# Patient Record
Sex: Female | Born: 1940 | Race: White | Hispanic: No | Marital: Married | Smoking: Never smoker
Health system: Southern US, Community
[De-identification: ages and names within clinical notes are randomized; demographics above are authoritative.]

## PROBLEM LIST (undated history)

## (undated) DIAGNOSIS — I10 Essential (primary) hypertension: Secondary | ICD-10-CM

## (undated) HISTORY — PX: TOTAL KNEE ARTHROPLASTY: SHX125

## (undated) HISTORY — PX: BACK SURGERY: SHX140

## (undated) HISTORY — PX: CHOLECYSTECTOMY: SHX55

## (undated) HISTORY — PX: OTHER SURGICAL HISTORY: SHX169

---

## 2015-03-03 ENCOUNTER — Encounter: Payer: Self-pay | Admitting: Emergency Medicine

## 2015-03-03 ENCOUNTER — Observation Stay
Admission: EM | Admit: 2015-03-03 | Discharge: 2015-03-04 | Disposition: A | Discharge status: Home / Self Care | Payer: Medicare Other | Attending: Internal Medicine | Admitting: Internal Medicine

## 2015-03-03 ENCOUNTER — Emergency Department: Payer: Medicare Other

## 2015-03-03 ENCOUNTER — Observation Stay: Payer: Medicare Other

## 2015-03-03 DIAGNOSIS — Z6837 Body mass index (BMI) 37.0-37.9, adult: Secondary | ICD-10-CM | POA: Diagnosis not present

## 2015-03-03 DIAGNOSIS — Z880 Allergy status to penicillin: Secondary | ICD-10-CM | POA: Insufficient documentation

## 2015-03-03 DIAGNOSIS — I440 Atrioventricular block, first degree: Secondary | ICD-10-CM | POA: Insufficient documentation

## 2015-03-03 DIAGNOSIS — Z79899 Other long term (current) drug therapy: Secondary | ICD-10-CM | POA: Insufficient documentation

## 2015-03-03 DIAGNOSIS — G8191 Hemiplegia, unspecified affecting right dominant side: Secondary | ICD-10-CM | POA: Insufficient documentation

## 2015-03-03 DIAGNOSIS — Z96651 Presence of right artificial knee joint: Secondary | ICD-10-CM | POA: Insufficient documentation

## 2015-03-03 DIAGNOSIS — E785 Hyperlipidemia, unspecified: Secondary | ICD-10-CM | POA: Insufficient documentation

## 2015-03-03 DIAGNOSIS — I351 Nonrheumatic aortic (valve) insufficiency: Secondary | ICD-10-CM | POA: Diagnosis not present

## 2015-03-03 DIAGNOSIS — R4781 Slurred speech: Secondary | ICD-10-CM | POA: Diagnosis not present

## 2015-03-03 DIAGNOSIS — K219 Gastro-esophageal reflux disease without esophagitis: Secondary | ICD-10-CM | POA: Insufficient documentation

## 2015-03-03 DIAGNOSIS — Z9049 Acquired absence of other specified parts of digestive tract: Secondary | ICD-10-CM | POA: Diagnosis not present

## 2015-03-03 DIAGNOSIS — Z889 Allergy status to unspecified drugs, medicaments and biological substances status: Secondary | ICD-10-CM | POA: Diagnosis not present

## 2015-03-03 DIAGNOSIS — I639 Cerebral infarction, unspecified: Secondary | ICD-10-CM | POA: Diagnosis present

## 2015-03-03 DIAGNOSIS — M62838 Other muscle spasm: Secondary | ICD-10-CM | POA: Diagnosis not present

## 2015-03-03 DIAGNOSIS — Z7982 Long term (current) use of aspirin: Secondary | ICD-10-CM | POA: Diagnosis not present

## 2015-03-03 DIAGNOSIS — I6523 Occlusion and stenosis of bilateral carotid arteries: Secondary | ICD-10-CM | POA: Insufficient documentation

## 2015-03-03 DIAGNOSIS — R29810 Facial weakness: Secondary | ICD-10-CM | POA: Insufficient documentation

## 2015-03-03 DIAGNOSIS — I350 Nonrheumatic aortic (valve) stenosis: Secondary | ICD-10-CM | POA: Diagnosis not present

## 2015-03-03 DIAGNOSIS — E669 Obesity, unspecified: Secondary | ICD-10-CM | POA: Diagnosis not present

## 2015-03-03 DIAGNOSIS — R011 Cardiac murmur, unspecified: Secondary | ICD-10-CM | POA: Diagnosis not present

## 2015-03-03 DIAGNOSIS — I340 Nonrheumatic mitral (valve) insufficiency: Secondary | ICD-10-CM | POA: Insufficient documentation

## 2015-03-03 DIAGNOSIS — E039 Hypothyroidism, unspecified: Secondary | ICD-10-CM | POA: Diagnosis not present

## 2015-03-03 DIAGNOSIS — I10 Essential (primary) hypertension: Secondary | ICD-10-CM | POA: Diagnosis not present

## 2015-03-03 DIAGNOSIS — I071 Rheumatic tricuspid insufficiency: Secondary | ICD-10-CM | POA: Diagnosis not present

## 2015-03-03 DIAGNOSIS — G459 Transient cerebral ischemic attack, unspecified: Secondary | ICD-10-CM | POA: Diagnosis present

## 2015-03-03 HISTORY — DX: Essential (primary) hypertension: I10

## 2015-03-03 LAB — COMPREHENSIVE METABOLIC PANEL
ALT: 14 U/L (ref 14–54)
ANION GAP: 8 (ref 5–15)
AST: 29 U/L (ref 15–41)
Albumin: 3.9 g/dL (ref 3.5–5.0)
Alkaline Phosphatase: 73 U/L (ref 38–126)
BILIRUBIN TOTAL: 0.5 mg/dL (ref 0.3–1.2)
BUN: 24 mg/dL — AB (ref 6–20)
CO2: 26 mmol/L (ref 22–32)
Calcium: 9 mg/dL (ref 8.9–10.3)
Chloride: 104 mmol/L (ref 101–111)
Creatinine, Ser: 1.11 mg/dL — ABNORMAL HIGH (ref 0.44–1.00)
GFR, EST AFRICAN AMERICAN: 56 mL/min — AB (ref >60)
GFR, EST NON AFRICAN AMERICAN: 48 mL/min — AB (ref >60)
Glucose, Bld: 120 mg/dL — ABNORMAL HIGH (ref 65–99)
POTASSIUM: 4.1 mmol/L (ref 3.5–5.1)
Sodium: 138 mmol/L (ref 135–145)
TOTAL PROTEIN: 7 g/dL (ref 6.5–8.1)

## 2015-03-03 LAB — URINALYSIS COMPLETE WITH MICROSCOPIC (ARMC ONLY)
BACTERIA UA: NONE SEEN
Bilirubin Urine: NEGATIVE
Glucose, UA: NEGATIVE mg/dL
HGB URINE DIPSTICK: NEGATIVE
Ketones, ur: NEGATIVE mg/dL
LEUKOCYTES UA: NEGATIVE
NITRITE: NEGATIVE
PROTEIN: NEGATIVE mg/dL
RBC / HPF: NONE SEEN RBC/hpf (ref 0–5)
SPECIFIC GRAVITY, URINE: 1.004 — AB (ref 1.005–1.030)
pH: 6 (ref 5.0–8.0)

## 2015-03-03 LAB — LIPID PANEL
CHOL/HDL RATIO: 4.3 ratio
CHOLESTEROL: 183 mg/dL (ref 0–200)
HDL: 43 mg/dL (ref >40)
LDL Cholesterol: 116 mg/dL — ABNORMAL HIGH (ref 0–99)
Triglycerides: 121 mg/dL (ref <150)
VLDL: 24 mg/dL (ref 0–40)

## 2015-03-03 LAB — CBC
HEMATOCRIT: 34.6 % — AB (ref 35.0–47.0)
HEMOGLOBIN: 11.4 g/dL — AB (ref 12.0–16.0)
MCH: 31.2 pg (ref 26.0–34.0)
MCHC: 32.9 g/dL (ref 32.0–36.0)
MCV: 94.8 fL (ref 80.0–100.0)
Platelets: 195 10*3/uL (ref 150–440)
RBC: 3.65 MIL/uL — ABNORMAL LOW (ref 3.80–5.20)
RDW: 13.4 % (ref 11.5–14.5)
WBC: 5.9 10*3/uL (ref 3.6–11.0)

## 2015-03-03 LAB — TROPONIN I: Troponin I: <0.03 ng/mL (ref <0.031)

## 2015-03-03 LAB — MAGNESIUM: MAGNESIUM: 1.7 mg/dL (ref 1.7–2.4)

## 2015-03-03 MED ORDER — STROKE: EARLY STAGES OF RECOVERY BOOK
Freq: Once | Status: AC
  Administered 2015-03-03: 18:00:00

## 2015-03-03 MED ORDER — DIPHENHYDRAMINE HCL 25 MG PO CAPS: 25.0000 mg | ORAL_CAPSULE | Freq: Every evening | ORAL | Status: DC | PRN

## 2015-03-03 MED ORDER — METOCLOPRAMIDE HCL 5 MG PO TABS
5.0000 mg | ORAL_TABLET | Freq: Three times a day (TID) | ORAL | Status: DC
Start: 2015-03-04 — End: 2015-03-04
  Administered 2015-03-04 (×2): 5 mg via ORAL
  Filled 2015-03-03 (×2): qty 1

## 2015-03-03 MED ORDER — PANTOPRAZOLE SODIUM 40 MG PO TBEC
40.0000 mg | DELAYED_RELEASE_TABLET | Freq: Every day | ORAL | Status: DC
  Administered 2015-03-03 – 2015-03-04 (×2): 40 mg via ORAL
  Filled 2015-03-03 (×2): qty 1

## 2015-03-03 MED ORDER — ENOXAPARIN SODIUM 40 MG/0.4ML ~~LOC~~ SOLN: 40.0000 mg | SUBCUTANEOUS | Status: DC

## 2015-03-03 MED ORDER — SODIUM CHLORIDE 0.9 % IJ SOLN
3.0000 mL | INTRAMUSCULAR | Status: DC | PRN
Start: 2015-03-03 — End: 2015-03-04

## 2015-03-03 MED ORDER — OCUVITE-LUTEIN PO CAPS
1.0000 | ORAL_CAPSULE | Freq: Every day | ORAL | Status: DC
  Administered 2015-03-03 – 2015-03-04 (×2): 1 via ORAL
  Filled 2015-03-03 (×2): qty 1

## 2015-03-03 MED ORDER — ASPIRIN 81 MG PO CHEW
324.0000 mg | CHEWABLE_TABLET | Freq: Once | ORAL | Status: AC
  Administered 2015-03-03: 324 mg via ORAL
  Filled 2015-03-03: qty 4

## 2015-03-03 MED ORDER — ADULT MULTIVITAMIN W/MINERALS CH
1.0000 | ORAL_TABLET | Freq: Every day | ORAL | Status: DC
  Administered 2015-03-03 – 2015-03-04 (×2): 1 via ORAL
  Filled 2015-03-03 (×2): qty 1

## 2015-03-03 MED ORDER — LEVOTHYROXINE SODIUM 88 MCG PO TABS
88.0000 ug | ORAL_TABLET | Freq: Every day | ORAL | Status: DC
  Administered 2015-03-04: 88 ug via ORAL
  Filled 2015-03-03: qty 1

## 2015-03-03 MED ORDER — SENNOSIDES-DOCUSATE SODIUM 8.6-50 MG PO TABS: 1.0000 | ORAL_TABLET | Freq: Every evening | ORAL | Status: DC | PRN

## 2015-03-03 MED ORDER — LEVETIRACETAM 750 MG PO TABS
750.0000 mg | ORAL_TABLET | Freq: Two times a day (BID) | ORAL | Status: DC
  Administered 2015-03-03 – 2015-03-04 (×2): 750 mg via ORAL
  Filled 2015-03-03 (×3): qty 1

## 2015-03-03 MED ORDER — DULOXETINE HCL 60 MG PO CPEP
60.0000 mg | ORAL_CAPSULE | Freq: Every day | ORAL | Status: DC
  Administered 2015-03-03 – 2015-03-04 (×2): 60 mg via ORAL
  Filled 2015-03-03 (×2): qty 1

## 2015-03-03 MED ORDER — SODIUM CHLORIDE 0.9 % IJ SOLN
3.0000 mL | Freq: Two times a day (BID) | INTRAMUSCULAR | Status: DC
  Administered 2015-03-03 – 2015-03-04 (×2): 3 mL via INTRAVENOUS

## 2015-03-03 MED ORDER — LISINOPRIL 10 MG PO TABS
5.0000 mg | ORAL_TABLET | Freq: Every day | ORAL | Status: DC
  Administered 2015-03-03 – 2015-03-04 (×2): 5 mg via ORAL
  Filled 2015-03-03 (×2): qty 1

## 2015-03-03 MED ORDER — SODIUM CHLORIDE 0.9 % IV SOLN: 250.0000 mL | INTRAVENOUS | Status: DC | PRN

## 2015-03-03 NOTE — ED Notes (Signed)
Pt ambulatory with minimal assist from RN and husband

## 2015-03-03 NOTE — ED Notes (Signed)
Pt states she woke up this am at 0700 with generalized weakness in her legs and right sided arm weakness, states she has fallen multiple times this am due to weakness, family states they noticed a little slurred speech, pt also states she was unable to dial the phone, no facial drooping noted, equal grip strength, and no drift noted

## 2015-03-03 NOTE — H&P (Signed)
[facility] - [place] at [facility]   PATIENT NAME: Karen Lara    MR#:  [id number]  DATE OF BIRTH:  25-Jun-1940  DATE OF ADMISSION:  03/03/2015  PRIMARY CARE PHYSICIAN: No PCP Per Patient pt is from [place]  REQUESTING/REFERRING PHYSICIAN: Dr York Cerise  CHIEF COMPLAINT:  Right upper extremity lower extremity weakness with falls at home. Patient is from out of town [place] visiting her daughter.  HISTORY OF PRESENT ILLNESS:  Karen Lara  is a 74 y.o. female with a known history of Hypertension, muscle spasms and lower extremity, hypothyroidism, GERD comes to the emergency room with her family after she noticed some right upper exudate or extremity weakness today. Patient had couple falls at home without any injury. She's been walking around in the ER with some assistance. CT of the head was done which is negative for CVA. No slurred speech noted. Patient does continue to have some right upper and lower Extremityweakness. She is being admitted For further evaluation and management Possible TIA versus CVA.  PAST MEDICAL HISTORY:   Past Medical History  Diagnosis Date  . Hypertension     PAST SURGICAL HISTOIRY:   Past Surgical History  Procedure Laterality Date  . Back surgery    . Total knee arthroplasty Right   . Cholecystectomy      SOCIAL HISTORY:   Social History  Substance Use Topics  . Smoking status: Never Smoker   . Smokeless tobacco: Not on file  . Alcohol Use: Yes     Comment: occassional    FAMILY HISTORY:  No family history on file.  DRUG ALLERGIES:   Allergies  Allergen Reactions  . Penicillins Hives and Other (See Comments)    Has patient had a PCN reaction causing immediate rash, facial/tongue/throat swelling, SOB or lightheadedness with hypotension: No Has patient had a PCN reaction causing severe rash involving mucus membranes or skin necrosis: No Has patient had a PCN reaction that required  hospitalization No Has patient had a PCN reaction occurring within the last 10 years: No If all of the above answers are "NO", then may proceed with Cephalosporin use.  . Magnesium-Containing Compounds Itching and Rash    REVIEW OF SYSTEMS:  Review of Systems  Constitutional: Negative for fever, chills and weight loss.  HENT: Negative for ear discharge, ear pain and nosebleeds.   Eyes: Negative for blurred vision, pain and discharge.  Respiratory: Negative for sputum production, shortness of breath, wheezing and stridor.   Cardiovascular: Negative for chest pain, palpitations, orthopnea and PND.  Gastrointestinal: Negative for nausea, vomiting, abdominal pain and diarrhea.  Genitourinary: Negative for urgency and frequency.  Musculoskeletal: Negative for back pain and joint pain.  Neurological: Positive for weakness. Negative for sensory change, speech change and focal weakness.  Psychiatric/Behavioral: Negative for depression and hallucinations. The patient is not nervous/anxious.   All other systems reviewed and are negative.    MEDICATIONS AT HOME:   Prior to Admission medications   Medication Sig Start Date End Date Taking? Authorizing Provider  DULoxetine (CYMBALTA) 60 MG capsule Take 60 mg by mouth daily.   Yes Historical Provider, MD  levETIRAcetam (KEPPRA) 750 MG tablet Take 750 mg by mouth 2 (two) times daily.   Yes Historical Provider, MD  levothyroxine (SYNTHROID, LEVOTHROID) 88 MCG tablet Take 88 mcg by mouth daily.   Yes Historical Provider, MD  lisinopril (PRINIVIL,ZESTRIL) 5 MG tablet Take 5 mg by mouth daily.   Yes Historical Provider, MD  MELATONIN PO  Take 2 tablets by mouth at bedtime as needed (for sleep).   Yes Historical Provider, MD  Multiple Vitamin (MULTIVITAMIN WITH MINERALS) TABS tablet Take 1 tablet by mouth daily.   Yes Historical Provider, MD  multivitamin-lutein (OCUVITE-LUTEIN) CAPS capsule Take 1 capsule by mouth daily.   Yes Historical Provider, MD   OVER THE COUNTER MEDICATION Take 1 capsule by mouth 4 (four) times daily. Pt states that she takes a medication for her stomach.   Yes Historical Provider, MD  pantoprazole (PROTONIX) 40 MG tablet Take 40 mg by mouth daily.   Yes Historical Provider, MD      VITAL SIGNS:  Blood pressure 155/75, pulse 73, temperature 97.8 F (36.6 C), temperature source Oral, resp. rate 18, height  (1.575 m), weight 92.987 kg (205 lb), SpO2 98 %.  PHYSICAL EXAMINATION:  GENERAL:  74 y.o.-year-old patient lying in the bed with no acute distress.  EYES: Pupils equal, round, reactive to light and accommodation. No scleral icterus. Extraocular muscles intact.  HEENT: Head atraumatic, normocephalic. Oropharynx and nasopharynx clear.  NECK:  Supple, no jugular venous distention. No thyroid enlargement, no tenderness.  LUNGS: Normal breath sounds bilaterally, no wheezing, rales,rhonchi or crepitation. No use of accessory muscles of respiration.  CARDIOVASCULAR: S1, S2 normal. No murmurs, rubs, or gallops.  ABDOMEN: Soft, nontender, nondistended. Bowel sounds present. No organomegaly or mass.  EXTREMITIES: No pedal edema, cyanosis, or clubbing.  NEUROLOGIC: Cranial nerves II through XII are intact. Muscle strength 5/5 in all extremities. Sensation intact. Gait not checked. Minimal muscle spasms and left lower activity noted. PSYCHIATRIC: The patient is alert and oriented x 3.  SKIN: No obvious rash, lesion, or ulcer.   LABORATORY PANEL:   CBC  Recent Labs Lab 03/03/15 1206  WBC 5.9  HGB 11.4*  HCT 34.6*  PLT 195   ------------------------------------------------------------------------------------------------------------------  Chemistries   Recent Labs Lab 03/03/15 1206  NA 138  K 4.1  CL 104  CO2 26  GLUCOSE 120*  BUN 24*  CREATININE 1.11*  CALCIUM 9.0  MG 1.7  AST 29  ALT 14  ALKPHOS 73  BILITOT 0.5    ------------------------------------------------------------------------------------------------------------------  Cardiac Enzymes  Recent Labs Lab 03/03/15 1206  TROPONINI <0.03   ------------------------------------------------------------------------------------------------------------------  RADIOLOGY:  Ct Head Wo Contrast  03/03/2015  CLINICAL DATA:  Generalized weakness in legs and right arm weakness. Fall multiple times EXAM: CT HEAD WITHOUT CONTRAST TECHNIQUE: Contiguous axial images were obtained from the base of the skull through the vertex without intravenous contrast. COMPARISON:  None. FINDINGS: No acute intracranial abnormality. Specifically, no hemorrhage, hydrocephalus, mass lesion, acute infarction, or significant intracranial injury. No acute calvarial abnormality. Visualized paranasal sinuses and mastoids clear. Orbital soft tissues unremarkable. IMPRESSION: Normal study. Electronically Signed   By: Charlett Nose M.D.   On: 03/03/2015 12:01    EKG:   Sinus rhythm with first-degree AV block. IMPRESSION AND PLAN:   Daly Whipkey  is a 74 y.o. female with a known history of Hypertension, muscle spasms and lower extremity, hypothyroidism, GERD comes to the emergency room with her family after she noticed some right upper exudate or extremity weakness today.  1. Right upper lower extremity weakness suspect TIA versus CVA Risk factors hypertension CT head negative for CVA Admits to medical floor with cardiac monitoring Neuro checks per protocol Continue aspirin daily as home dose MRI of the brain, ultrasound carotid, echo PT to see patient  2. Hypothyroidism continue Synthroid  3. GERD continue protonic's  4. Physical therapy to  see patient. Management for discharge planning.  5. DVT prophylaxis subcutaneous Lovenox     All the records are reviewed and case discussed with ED provider. Management plans discussed with the patient, family and they are in  agreement.  CODE STATUS:Full  TOTAL TIME TAKING CARE OF THIS PATIENT: 50 minutes.    Ysidro Ramsay M.D on 03/03/2015 at 2:51 PM  Between 7am to 6pm - Pager - [phone]  After 6pm go to [email or web address] - password [email or web address] [email or web address]  [facility]  Office  [phone]  CC: Primary care physician; No PCP Per Patient

## 2015-03-03 NOTE — ED Provider Notes (Signed)
[facility] Emergency Department Provider Note  ____________________________________________  Time seen: Approximately 12:21 PM  I have reviewed the triage vital signs and the nursing notes.   HISTORY  Chief Complaint Weakness    HPI Karen Lara is a 74 y.o. female with a history of hypertension and obesity but no prior CVAs/TIAs who woke up this morning at approximately 7 AM with weakness in her right arm, right leg and having some difficulty speaking due to some apparent facial droop.The facial droop and the slurred speech has completely resolved but the right arm and leg weakness has persisted.  She had 2 falls this morning due to the right leg weakness where she did not sustain any injury, did not strike her head or lose consciousness, and has no neck pain, but was a result of the leg weakness.  She denies fever/chills, chest pain, shortness of breath, abdominal pain, nausea/vomiting, headache.  She has never had these kinds of symptoms in the past.  She is on no blood thinner other than a baby dose aspirin but she did not take it this morning.  Of note her blood pressure is significantly elevated in the emergency department but is unclear and she does not remember if she took her blood pressure medicine this morning.  She describes the symptoms as moderate and concerning given her inability to use the right side of her body.  She was normal when she went to bed last night but the symptoms were present when she awoke.   Past Medical History  Diagnosis Date  . Hypertension     Patient Active Problem List   Diagnosis Date Noted  . TIA (transient ischemic attack) 03/03/2015    Past Surgical History  Procedure Laterality Date  . Back surgery    . Total knee arthroplasty Right   . Cholecystectomy    . Other surgical history Right     shoulder rotator cuff    No current outpatient prescriptions on file.  Allergies Penicillins and Magnesium-containing  compounds  History reviewed. No pertinent family history.  Social History Social History  Substance Use Topics  . Smoking status: Never Smoker   . Smokeless tobacco: None  . Alcohol Use: Yes     Comment: occassional    Review of Systems Constitutional: No fever/chills Eyes: No visual changes. ENT: No sore throat. Cardiovascular: Denies chest pain. Respiratory: Denies shortness of breath. Gastrointestinal: No abdominal pain.  No nausea, no vomiting.  No diarrhea.  No constipation. Genitourinary: Negative for dysuria. Musculoskeletal: Negative for back pain. Skin: Negative for rash. Neurological: Focal weakness of her right arm and right leg.  Prior slurred speech which has since resolved.  10-point ROS otherwise negative.  ____________________________________________   PHYSICAL EXAM:  VITAL SIGNS: ED Triage Vitals  Enc Vitals Group     BP 03/03/15 1129 159/84 mmHg     Pulse Rate 03/03/15 1129 69     Resp 03/03/15 1129 18     Temp 03/03/15 1129 97.8 F (36.6 C)     Temp Source 03/03/15 1129 Oral     SpO2 03/03/15 1129 98 %     Weight 03/03/15 1129 205 lb (92.987 kg)     Height 03/03/15 1129  (1.575 m)     Head Cir --      Peak Flow --      Pain Score 03/03/15 1130 0     Pain Loc --      Pain Edu? --  Excl. in GC? --     Constitutional: Alert and oriented. Well appearing and in no acute distress. Eyes: Conjunctivae are normal. PERRL. EOMI. Head: Atraumatic. Nose: No congestion/rhinnorhea. Mouth/Throat: Mucous membranes are moist.  Oropharynx non-erythematous. Neck: No stridor.   Cardiovascular: Normal rate, regular rhythm. Grossly normal heart sounds.  Good peripheral circulation. Respiratory: Normal respiratory effort.  No retractions. Lungs CTAB. Gastrointestinal: Soft and nontender. No distention. No abdominal bruits. No CVA tenderness. Musculoskeletal: No lower extremity tenderness nor edema.  No joint effusions. Neurologic:  Normal speech and  language.  Cranial nerves are grossly intact.  The patient has 3 out of 5 strength in her right upper extremity with normal sensation.  The strength in her right lower extremity is 3-4 out of 5 also with normal sensation.  Reflexes are normal.  Left-sided extremities with normal strength throughout.  Skin:  Skin is warm, dry and intact. No rash noted. Psychiatric: Mood and affect are normal. Speech and behavior are normal.  ____________________________________________   LABS (all labs ordered are listed, but only abnormal results are displayed)  Labs Reviewed  CBC - Abnormal; Notable for the following:    RBC 3.65 (*)    Hemoglobin 11.4 (*)    HCT 34.6 (*)    All other components within normal limits  URINALYSIS COMPLETEWITH MICROSCOPIC ([facility] ONLY) - Abnormal; Notable for the following:    Color, Urine STRAW (*)    APPearance CLEAR (*)    Specific Gravity, Urine 1.004 (*)    Squamous Epithelial / LPF 0-5 (*)    All other components within normal limits  COMPREHENSIVE METABOLIC PANEL - Abnormal; Notable for the following:    Glucose, Bld 120 (*)    BUN 24 (*)    Creatinine, Ser 1.11 (*)    GFR calc non Af Amer 48 (*)    GFR calc Af Amer 56 (*)    All other components within normal limits  MAGNESIUM  TROPONIN I  LIPID PANEL  CBG MONITORING, ED   ____________________________________________  EKG  ED ECG REPORT I, Ulyana Pitones, the attending physician, personally viewed and interpreted this ECG.  Date: 03/03/2015 EKG Time: 11:34 Rate: 67 Rhythm: Sinus rhythm with first-degree AV block QRS Axis: normal Intervals: First-degree AV block ST/T Wave abnormalities: normal Conduction Disutrbances: none Narrative Interpretation: unremarkable  ____________________________________________  RADIOLOGY   Ct Head Wo Contrast  03/03/2015  CLINICAL DATA:  Generalized weakness in legs and right arm weakness. Fall multiple times EXAM: CT HEAD WITHOUT CONTRAST TECHNIQUE: Contiguous  axial images were obtained from the base of the skull through the vertex without intravenous contrast. COMPARISON:  None. FINDINGS: No acute intracranial abnormality. Specifically, no hemorrhage, hydrocephalus, mass lesion, acute infarction, or significant intracranial injury. No acute calvarial abnormality. Visualized paranasal sinuses and mastoids clear. Orbital soft tissues unremarkable. IMPRESSION: Normal study. Electronically Signed   By: Charlett NoseKevin  Dover M.D.   On: 03/03/2015 12:01   [place] Carotid Bilateral  03/03/2015  CLINICAL DATA:  Stroke.  Hypertension. EXAM: BILATERAL CAROTID DUPLEX ULTRASOUND TECHNIQUE: Wallace CullensGray scale imaging, color Doppler and duplex ultrasound was performed of bilateral carotid and vertebral arteries in the neck. COMPARISON:  None. REVIEW OF SYSTEMS: Quantification of carotid stenosis is based on velocity parameters that correlate the residual internal carotid diameter with NASCET-based stenosis levels, using the diameter of the distal internal carotid lumen as the denominator for stenosis measurement. The following velocity measurements were obtained: PEAK SYSTOLIC/END DIASTOLIC RIGHT ICA:  79/29cm/sec CCA:                     75/27cm/sec SYSTOLIC ICA/CCA RATIO:  1.1 DIASTOLIC ICA/CCA RATIO: 1.4 ECA:                     78cm/sec LEFT ICA:                     77/23cm/sec CCA:                     81/22cm/sec SYSTOLIC ICA/CCA RATIO:  1.0 DIASTOLIC ICA/CCA RATIO: 1.0 ECA:                     90cm/sec FINDINGS: RIGHT CAROTID ARTERY: Smooth plaque in the carotid bulb extending into the proximal ICA without high-grade stenosis. Normal waveforms and color Doppler signal. RIGHT VERTEBRAL ARTERY:  Normal flow direction and waveform. LEFT CAROTID ARTERY: Eccentric plaque in the carotid bulb and ICA origin. No high-grade stenosis. Normal waveforms and color Doppler signal. LEFT VERTEBRAL ARTERY: Normal flow direction and waveform. IMPRESSION: 1. 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 The exam does not exclude plaque ulceration or embolization. Continued surveillance recommended. Electronically Signed   By: Corlis Leak M.D.   On: 03/03/2015 16:03    ____________________________________________   PROCEDURES  Procedure(s) performed: None  Critical Care performed: No  NIH Stroke Scale  Interval: Baseline Time: 12:30 PM Person Administering Scale: Enes Rokosz  Administer stroke scale items in the order listed. Record performance in each category after each subscale exam. Do not go back and change scores. Follow directions provided for each exam technique. Scores should reflect what the patient does, not what the clinician thinks the patient can do. The clinician should record answers while administering the exam and work quickly. Except where indicated, the patient should not be coached (i.e., repeated requests to patient to make a special effort).   1a  Level of consciousness: 0=alert; keenly responsive  1b. LOC questions:  0=Performs both tasks correctly  1c. LOC commands: 0=Performs both tasks correctly  2.  Best Gaze: 0=normal  3.  Visual: 0=No visual loss  4. Facial Palsy: 0=Normal symmetric movement  5a.  Motor left arm: 0=No drift, limb holds 90 (or 45) degrees for full 10 seconds  5b.  Motor right arm: 2=Some effort against gravity, limb cannot get to or maintain (if cured) 90 (or 45) degrees, drifts down to bed, but has some effort against gravity  6a. motor left leg: 0=No drift, limb holds 90 (or 45) degrees for full 10 seconds  6b  Motor right leg:  2=Some effort against gravity, limb cannot get to or maintain (if cured) 90 (or 45) degrees, drifts down to bed, but has some effort against gravity  7. Limb Ataxia: 0=Absent  8.  Sensory: 0=Normal; no sensory loss  9. Best Language:  0=No aphasia, normal  10. Dysarthria: 0=Normal  11. Extinction and Inattention: 0=No abnormality  12. Distal  motor function: 0=Normal   Total:   4     ____________________________________________   INITIAL IMPRESSION / ASSESSMENT AND PLAN / ED COURSE  Pertinent labs & imaging results that were available during my care of the patient were reviewed by me and considered in my medical decision making (see chart for details).  Strongly consistent with CVA.  Well outside of the window for TPA given that she  awoke with the symptoms.  I gave a full dose aspirin and admitted to the hospitalist for further management.  The patient is stable.  I suspect her hypertension is compensatory hypertension in the setting of an acute CVA and I will not intervene medically at this time.    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

## 2015-03-03 NOTE — Care Management Obs Status (Signed)
MEDICARE OBSERVATION STATUS NOTIFICATION   Patient Details  Name: Karen Lara MRN: [id number] Date of Birth: July 19, 1940   Medicare Observation Status Notification Given:  Yes Given to patient and family in ER. Daughter signed for patient.    Berna Bueheryl Ike Maragh, RN 03/03/2015, 2:47 PM

## 2015-03-04 ENCOUNTER — Observation Stay
Admit: 2015-03-04 | Discharge: 2015-03-04 | Disposition: A | Discharge status: Home / Self Care | Payer: Medicare Other | Attending: Internal Medicine | Admitting: Internal Medicine

## 2015-03-04 ENCOUNTER — Observation Stay: Payer: Medicare Other

## 2015-03-04 DIAGNOSIS — I639 Cerebral infarction, unspecified: Secondary | ICD-10-CM | POA: Diagnosis not present

## 2015-03-04 MED ORDER — ATORVASTATIN CALCIUM 40 MG PO TABS: 40.0000 mg | ORAL_TABLET | Freq: Every day | ORAL | Status: AC

## 2015-03-04 MED ORDER — ATORVASTATIN CALCIUM 20 MG PO TABS: 40.0000 mg | ORAL_TABLET | Freq: Every day | ORAL | Status: DC

## 2015-03-04 MED ORDER — CLOPIDOGREL BISULFATE 75 MG PO TABS
75.0000 mg | ORAL_TABLET | Freq: Every day | ORAL | Status: DC
  Administered 2015-03-04: 75 mg via ORAL
  Filled 2015-03-04: qty 1

## 2015-03-04 MED ORDER — CLOPIDOGREL BISULFATE 75 MG PO TABS: 75.0000 mg | ORAL_TABLET | Freq: Every day | ORAL | Status: AC

## 2015-03-04 NOTE — Plan of Care (Signed)
Problem: Education: Goal: Knowledge of disease or condition will improve Outcome: Progressing - NIH score=5 - No complaints of pain.    Goal: Knowledge of secondary prevention will improve Stroke education booklet received.  Problem: Self-Care: Goal: Ability to participate in self-care as condition permits will improve Outcome: Progressing Ambulates with assist to BR.     Problem: Tissue Perfusion: Goal: Cerebral tissue perfusion will improve Outcome: Progressing Schedule for an MRI and echo today.  Problem: Safety: Goal: Ability to remain free from injury will improve Outcome: Progressing On high falls precautions per policy, offer toileting during hourly rounds.     Problem: Pain Managment: Goal: General experience of comfort will improve Outcome: Progressing No complaints of pain.

## 2015-03-04 NOTE — Progress Notes (Signed)
Speech Therapy Note: reviewed chart notes; consulted NSG then pt and family re: pt's status. Pt denies any swallowing problems or speech-language deficits; she is verbally conversive at conversational level w/ family/staff. Pt and family reported she is eating her meals and taking her meds w/ water w/ no swallowing difficulties; NSG reported no dysphagia as well. Pt appears at her baseline w/ speech and swallowing. No skilled ST services indicated currently. Pt agreed. ST will sign off w/ NSG to reconsult if any changes in status while admitted.

## 2015-03-04 NOTE — Plan of Care (Signed)
Problem: Education: Goal: Knowledge of disease or condition will improve Pt went for MRI and ECHO this am. NIH 1, pt improving

## 2015-03-04 NOTE — Evaluation (Signed)
Physical Therapy Evaluation Patient Details Name: Karen Lara MRN: [id number] DOB: 1940-08-31 Today's Date: 03/04/2015   History of Present Illness  74 yo female with onset of Rside weakness and recent fall was admitted with no acute CT changes found.  + carotid stenosis but less than 50%.  PMHx:  back suegery, TKA  Clinical Impression  Pt is getting up to walk with PT with some obvious deficits, but not tremendously unsteady.  Her plan is to get home to [place] where she can follow up with therapy.  Owing to the location in another state, will need to get the order for her therapy from her own physician unless she plans to stay in [place] awhile longer.    Follow Up Recommendations Outpatient PT;Supervision/Assistance - 24 hour (family assist at dc)    Equipment Recommendations  Rolling walker with 5" wheels    Recommendations for Other Services       Precautions / Restrictions Precautions Precautions: Fall (telemetry) Restrictions Weight Bearing Restrictions: No      Mobility  Bed Mobility Overal bed mobility: Modified Independent;Needs Assistance Bed Mobility: Supine to Sit;Sit to Supine     Supine to sit: Supervision;Min assist (Supervision on second attempt) Sit to supine: Supervision   General bed mobility comments: asked for help initially but on second try did herself  Transfers Overall transfer level: Modified independent Equipment used: 1 person hand held assist             General transfer comment: pt can get up alone  Ambulation/Gait Ambulation/Gait assistance: Min assist (HHA) Ambulation Distance (Feet): 300 Feet Assistive device: 1 person hand held assist Gait Pattern/deviations: Step-through pattern;Narrow base of support;Ataxic (mild change on RLE) Gait velocity: redcued Gait velocity interpretation: Below normal speed for age/gender General Gait Details: has occas need for LUE support due to RLE but can walk with no buckling on RLE  Stairs             Wheelchair Mobility    Modified Rankin (Stroke Patients Only)       Balance Overall balance assessment: History of Falls                                           Pertinent Vitals/Pain Pain Assessment: No/denies pain    Home Living Family/patient expects to be discharged to:: Private residence Living Arrangements: Spouse/significant other Available Help at Discharge: Family;Available 24 hours/day Type of Home: House Home Access: Stairs to enter Entrance Stairs-Rails: Right;Left;Can reach both Entrance Stairs-Number of Steps: 2 Home Layout: One level Home Equipment: Cane - single point;Shower seat Additional Comments: Pt agrees with getting a RW    Prior Function Level of Independence: Independent               Hand Dominance        Extremity/Trunk Assessment   Upper Extremity Assessment: Overall WFL for tasks assessed           Lower Extremity Assessment: RLE deficits/detail RLE Deficits / Details: requires assistance to steady gait with LUE    Cervical / Trunk Assessment: Normal  Communication   Communication: No difficulties  Cognition Arousal/Alertness: Awake/alert Behavior During Therapy: WFL for tasks assessed/performed Overall Cognitive Status: Within Functional Limits for tasks assessed                      General Comments General comments (skin  integrity, edema, etc.): According to previous issues pt is resolving quickly.  Plan is to get walker and she is headed home to [place] soon so will follow up therapy there.    Exercises        Assessment/Plan    PT Assessment Patient needs continued PT services  PT Diagnosis Difficulty walking;Hemiplegia dominant side   PT Problem List Decreased strength;Decreased range of motion;Decreased activity tolerance;Decreased balance;Decreased mobility;Decreased coordination;Decreased knowledge of use of DME;Cardiopulmonary status limiting activity;Obesity   PT Treatment Interventions DME instruction;Gait training;Stair training;Functional mobility training;Therapeutic activities;Therapeutic exercise;Balance training;Neuromuscular re-education;Patient/family education   PT Goals (Current goals can be found in the Care Plan section) Acute Rehab PT Goals Patient Stated Goal: to go home today PT Goal Formulation: With patient/family Time For Goal Achievement: 03/18/15 Potential to Achieve Goals: Good    Frequency 7X/week   Barriers to discharge Other (comment) (Home is in [place]) Here in town for the holiday    Co-evaluation               End of Session Equipment Utilized During Treatment: Gait belt Activity Tolerance: Patient tolerated treatment well;Other (comment);Patient limited by fatigue (Limited by RLE strength) Patient left: in bed;with call bell/phone within reach;with nursing/sitter in room;with family/visitor present Nurse Communication: Mobility status    Functional Assessment Tool Used: clinical judgment Functional Limitation: Mobility: Walking and moving around Mobility: Walking and Moving Around Current Status (Z6109): At least 20 percent but less than 40 percent impaired, limited or restricted Mobility: Walking and Moving Around Goal Status [phone]): At least 20 percent but less than 40 percent impaired, limited or restricted    Time: 1025-1045 PT Time Calculation (min) (ACUTE ONLY): 20 min   Charges:   PT Evaluation $Initial PT Evaluation Tier I: 1 Procedure     PT G Codes:   PT G-Codes **NOT FOR INPATIENT CLASS** Functional Assessment Tool Used: clinical judgment Functional Limitation: Mobility: Walking and moving around Mobility: Walking and Moving Around Current Status (U9811): At least 20 percent but less than 40 percent impaired, limited or restricted Mobility: Walking and Moving Around Goal Status [phone]): At least 20 percent but less than 40 percent impaired, limited or restricted    Ivar Drape 03/04/2015, 11:11 AM   Samul Dada, PT MS Acute Rehab Dept. Number: [facility] [id number] and [facility] [phone]

## 2015-03-04 NOTE — Progress Notes (Signed)
*  PRELIMINARY RESULTS* Echocardiogram 2D Echocardiogram has been performed.  Georgann HousekeeperJerry R Hege 03/04/2015, 9:32 AM

## 2015-03-04 NOTE — Discharge Summary (Signed)
[facility] -  at [facility]   PATIENT NAME: Karen Lara    MR#:  [id number]  DATE OF BIRTH:  [birth year]  DATE OF ADMISSION:  03/03/2015 ADMITTING PHYSICIAN: Enedina Finner, MD  DATE OF DISCHARGE: 03/04/2015  PRIMARY CARE PHYSICIAN: Follows in [place]   ADMISSION DIAGNOSIS:  CVA (cerebral infarction) [I63.9] Cerebral infarction due to unspecified mechanism [I63.9]  DISCHARGE DIAGNOSIS:  Active Problems:   TIA (transient ischemic attack)   SECONDARY DIAGNOSIS:   Past Medical History  Diagnosis Date  . Hypertension     HOSPITAL COURSE:   1. Acute stroke in the left posterior lentiform nucleus resulting in right-sided weakness. Her right-sided weakness had dramatically improved and she wants to go home. She is here visiting from [place] with family for Thanksgiving and she will go back on Saturday. Since she does take aspirin at home I took a step up and treatment with Plavix. I also added high intensity statin for LDL of 116. Her right-sided weakness had resolved. I wrote a prescription for a walker. Telemetry monitoring showed no arrhythmias. Carotid ultrasound showed bilateral less than 50% stenosis. Echocardiogram showed no source of cardiac emboli but did show severe aortic stenosis. 2. Severe aortic stenosis- Dr. Welton Flakes said he would follow-up the patient on Friday. The patient states that she does not want to do anything here with the aortic stenosis. She will follow up in [place]. 3. Essential hypertension- patient is on low-dose lisinopril 4. Hyperlipidemia unspecified. Start high-dose statin for LDL of 116 goal is less than 100 and more aggressively less than 70. 5. Hypothyroidism unspecified continue levothyroxine 6. Gastroesophageal reflux disease without esophagitis on PPI 7. Patient is on Keppra  DISCHARGE CONDITIONS:   Satisfactory  CONSULTS OBTAINED:  None  DRUG ALLERGIES:   Allergies  Allergen Reactions  . Penicillins  Hives and Other (See Comments)    Has patient had a PCN reaction causing immediate rash, facial/tongue/throat swelling, SOB or lightheadedness with hypotension: No Has patient had a PCN reaction causing severe rash involving mucus membranes or skin necrosis: No Has patient had a PCN reaction that required hospitalization No Has patient had a PCN reaction occurring within the last 10 years: No If all of the above answers are "NO", then may proceed with Cephalosporin use.  . Magnesium-Containing Compounds Itching and Rash    DISCHARGE MEDICATIONS:   Current Discharge Medication List    START taking these medications   Details  atorvastatin (LIPITOR) 40 MG tablet Take 1 tablet (40 mg total) by mouth daily at 6 PM. Qty: 30 tablet, Refills: 0    clopidogrel (PLAVIX) 75 MG tablet Take 1 tablet (75 mg total) by mouth daily. Qty: 30 tablet, Refills: 0      CONTINUE these medications which have NOT CHANGED   Details  DULoxetine (CYMBALTA) 60 MG capsule Take 60 mg by mouth daily.    levETIRAcetam (KEPPRA) 750 MG tablet Take 750 mg by mouth 2 (two) times daily.    levothyroxine (SYNTHROID, LEVOTHROID) 88 MCG tablet Take 88 mcg by mouth daily.    lisinopril (PRINIVIL,ZESTRIL) 5 MG tablet Take 5 mg by mouth daily.    MELATONIN PO Take 2 tablets by mouth at bedtime as needed (for sleep).    Multiple Vitamin (MULTIVITAMIN WITH MINERALS) TABS tablet Take 1 tablet by mouth daily.    multivitamin-lutein (OCUVITE-LUTEIN) CAPS capsule Take 1 capsule by mouth daily.    OVER THE COUNTER MEDICATION Take 1 capsule by mouth 4 (four) times daily. Pt  states that she takes a medication for her stomach.    pantoprazole (PROTONIX) 40 MG tablet Take 40 mg by mouth daily.         DISCHARGE INSTRUCTIONS:   Follow up in [place] with primary care physician, neurologist and cardiologist  If you experience worsening of your admission symptoms, develop shortness of breath, life threatening emergency,  suicidal or homicidal thoughts you must seek medical attention immediately by calling 911 or calling your MD immediately  if symptoms less severe.  You Must read complete instructions/literature along with all the possible adverse reactions/side effects for all the Medicines you take and that have been prescribed to you. Take any new Medicines after you have completely understood and accept all the possible adverse reactions/side effects.   Please note  You were cared for by a hospitalist during your hospital stay. If you have any questions about your discharge medications or the care you received while you were in the hospital after you are discharged, you can call the unit and asked to speak with the hospitalist on call if the hospitalist that took care of you is not available. Once you are discharged, your primary care physician will handle any further medical issues. Please note that NO REFILLS for any discharge medications will be authorized once you are discharged, as it is imperative that you return to your primary care physician (or establish a relationship with a primary care physician if you do not have one) for your aftercare needs so that they can reassess your need for medications and monitor your lab values.    Today   CHIEF COMPLAINT:   Chief Complaint  Patient presents with  . Weakness    HISTORY OF PRESENT ILLNESS:  Karen Lara  is a 74 y.o. female presented with right-sided weakness and found to have an acute stroke.   VITAL SIGNS:  Blood pressure 147/95, pulse 78, temperature 98.6 F (37 C), temperature source Oral, resp. rate 18, height  (1.575 m), weight 92.987 kg (205 lb), SpO2 96 %.    PHYSICAL EXAMINATION:  GENERAL:  74 y.o.-year-old patient lying in the bed with no acute distress.  EYES: Pupils equal, round, reactive to light and accommodation. No scleral icterus. Extraocular muscles intact.  HEENT: Head atraumatic, normocephalic. Oropharynx and  nasopharynx clear.  NECK:  Supple, no jugular venous distention. No thyroid enlargement, no tenderness.  LUNGS: Normal breath sounds bilaterally, no wheezing, rales,rhonchi or crepitation. No use of accessory muscles of respiration.  CARDIOVASCULAR: S1, S2 normal. 4/6 systolic murmur, no rubs, or gallops.  ABDOMEN: Soft, non-tender, non-distended. Bowel sounds present. No organomegaly or mass.  EXTREMITIES: Trace edema, no cyanosis, or clubbing.  NEUROLOGIC: Cranial nerves II through XII are intact. Muscle strength 5/5 in all extremities. Sensation intact. Gait not checked.  PSYCHIATRIC: The patient is alert and oriented x 3.  SKIN: No obvious rash, lesion, or ulcer.   DATA REVIEW:   CBC  Recent Labs Lab 03/03/15 1206  WBC 5.9  HGB 11.4*  HCT 34.6*  PLT 195    Chemistries   Recent Labs Lab 03/03/15 1206  NA 138  K 4.1  CL 104  CO2 26  GLUCOSE 120*  BUN 24*  CREATININE 1.11*  CALCIUM 9.0  MG 1.7  AST 29  ALT 14  ALKPHOS 73  BILITOT 0.5    Cardiac Enzymes  Recent Labs Lab 03/03/15 1206  TROPONINI <0.03    RADIOLOGY:  Ct Head Wo Contrast  03/03/2015  CLINICAL DATA:  Generalized weakness in legs and right arm weakness. Fall multiple times EXAM: CT HEAD WITHOUT CONTRAST TECHNIQUE: Contiguous axial images were obtained from the base of the skull through the vertex without intravenous contrast. COMPARISON:  None. FINDINGS: No acute intracranial abnormality. Specifically, no hemorrhage, hydrocephalus, mass lesion, acute infarction, or significant intracranial injury. No acute calvarial abnormality. Visualized paranasal sinuses and mastoids clear. Orbital soft tissues unremarkable. IMPRESSION: Normal study. Electronically Signed   By: Charlett NoseKevin  Dover M.D.   On: 03/03/2015 12:01   Mr Brain Wo Contrast  03/04/2015  CLINICAL DATA:  Right upper and lower extremity weakness.  Falls. EXAM: MRI HEAD WITHOUT CONTRAST TECHNIQUE: Multiplanar, multiecho pulse sequences of the brain  and surrounding structures were obtained without intravenous contrast. COMPARISON:  CT head without contrast 03/03/2015 FINDINGS: The diffusion-weighted images demonstrate acute nonhemorrhagic infarct involving the posterior aspect of the left putaminal. There is no acute hemorrhage or mass lesion. The infarct involves a portion of the posterior limb internal capsule. The basal ganglia are otherwise intact. Mild periventricular and subcortical T2 changes bilaterally are otherwise slightly exaggerated for age. No other acute infarct is present. There are no other significant cortical infarcts. The internal auditory canals are within normal limits. The brainstem and cerebellum are unremarkable. Flow is present in the major intracranial arteries. Bilateral lens replacements are present. The globes orbits are otherwise intact. The paranasal sinuses and mastoid air cells are clear. The skullbase is within normal limits. Midline structures are unremarkable. IMPRESSION: 1. Acute nonhemorrhagic infarct involving the posterior left lentiform nucleus and potentially the posterior limb internal capsule. 2. Mild periventricular and subcortical white matter changes are somewhat advanced for age. This likely reflects the sequela of chronic microvascular ischemia. Electronically Signed   By: Marin Robertshristopher  Mattern M.D.   On: 03/04/2015 10:15   [place] Carotid Bilateral  03/03/2015  CLINICAL DATA:  Stroke.  Hypertension. EXAM: BILATERAL CAROTID DUPLEX ULTRASOUND TECHNIQUE: Wallace CullensGray scale imaging, color Doppler and duplex ultrasound was performed of bilateral carotid and vertebral arteries in the neck. COMPARISON:  None. REVIEW OF SYSTEMS: Quantification of carotid stenosis is based on velocity parameters that correlate the residual internal carotid diameter with NASCET-based stenosis levels, using the diameter of the distal internal carotid lumen as the denominator for stenosis measurement. The following velocity measurements were  obtained: PEAK SYSTOLIC/END DIASTOLIC RIGHT ICA:                     79/29cm/sec CCA:                     75/27cm/sec SYSTOLIC ICA/CCA RATIO:  1.1 DIASTOLIC ICA/CCA RATIO: 1.4 ECA:                     78cm/sec LEFT ICA:                     77/23cm/sec CCA:                     81/22cm/sec SYSTOLIC ICA/CCA RATIO:  1.0 DIASTOLIC ICA/CCA RATIO: 1.0 ECA:                     90cm/sec FINDINGS: RIGHT CAROTID ARTERY: Smooth plaque in the carotid bulb extending into the proximal ICA without high-grade stenosis. Normal waveforms and color Doppler signal. RIGHT VERTEBRAL ARTERY:  Normal flow direction and waveform. LEFT CAROTID ARTERY: Eccentric plaque in the carotid bulb and ICA origin. No high-grade stenosis. Normal waveforms and  color Doppler signal. LEFT VERTEBRAL ARTERY: Normal flow direction and waveform. IMPRESSION: 1. Early bilateral carotid bulb and proximal ICA plaque resulting in less than 50% diameter stenosis. The exam does not exclude plaque ulceration or embolization. Continued surveillance recommended. Electronically Signed   By: Corlis Leak M.D.   On: 03/03/2015 16:03   Management plans discussed with the patient, family and they are in agreement.  CODE STATUS:     Code Status Orders        Start     Ordered   03/03/15 1605  Full code   Continuous     03/03/15 1604    Advance Directive Documentation        Most Recent Value   Type of Advance Directive  Living will   Pre-existing out of facility DNR order (yellow form or pink MOST form)     "MOST" Form in Place?        TOTAL TIME TAKING CARE OF THIS PATIENT: 35 minutes.    Alford Highland M.D on 03/04/2015 at 3:33 PM  Between 7am to 6pm - Pager - [phone]  After 6pm go to [email or web address] - password [email or web address] [email or web address]  Office  [phone]  CC: Primary care physician; patient follows in [place]

## 2015-03-04 NOTE — Progress Notes (Signed)
Pt and family given d/c instructions r/t activity, diet, followup care and medications, prescriptions x 2 given to pt. Pt and family voiced understanding, pt d/c home via wheelchair escorted by family and staff, walker given to pt at discharge.

## 2015-03-04 NOTE — Discharge Instructions (Signed)
Ischemic Stroke Treated Without Warfarin °An ischemic stroke is the sudden death of brain tissue. Blood carries oxygen to all areas of your body. This type of stroke happens when your blood does not flow to your brain like normal. Your brain cannot get the oxygen it needs. This is an emergency. °Symptoms of any stroke usually happen all of the sudden. You may notice them when you wake up. They can include sudden: °· Weakness or loss of feeling in your face, arm, or leg, especially on one side of your body. °· Confusion. °· Trouble talking or understanding. °· Trouble seeing. °· Trouble walking or moving your arms or legs. °· Dizziness. °· Loss of balance or coordination. °· Severe headache with no known cause. °Get help as soon as any of these problems start. This is important so your doctor can treat you right away with:  °· Medicines that dissolve blood clots. °· A device to remove a blood clot. °A few hours after the start of your symptoms, your doctor may treat you with: °· Oxygen. °· Medicines. °· A surgery to widen blood vessels. °RISK FACTORS  °Risk factors are things that make it more likely for you to have a stroke. These include: °· High blood pressure. °· High cholesterol. °· Diabetes. °· Heart disease. °· Having a buildup of fatty deposits in the blood vessels. °· Having an abnormal heart rhythm (atrial fibrillation). °· Being very overweight (obese). °· Smoking. °· Taking birth control pills, especially if you smoke. °· Not being active. °· Having a diet that is high in fats, salt, and calories. °· Drinking too much alcohol. °· Using illegal drugs. °· Being African American. °· Being over the age of 55. °· Having a family history of stroke. °· Having a history of blood clots, stroke, warning stroke (transient ischemic attack, TIA), or heart attack. °· Sickle cell disease. °HOME CARE °· Take all medicines exactly as told by your doctor. Understand all your medicine instructions. °· If you are able to  swallow, eat healthy foods. Eat 5 or more servings of fruits and vegetables each day. Eat soft foods or pureed foods, or eat small bites of food so you do not choke. °· Stay active. Try to get at least 30 minutes of activity on most or all days. °· Do not smoke. °· Limit or stop alcohol use. °· Keep your home safe so you do not fall. Try: °¨ Putting grab bars in the bedroom and bathroom. °¨ Raising toilet seats. °¨ Putting a seat in the shower. °· Go to physical, occupational, and speech therapy sessions as told by your doctor. °· Use a walker or a cane at all times if told to do so. °· Keep all doctor visits as told. °GET HELP RIGHT AWAY IF:  °· You have sudden weakness or loss of feeling in your face, arm, or leg, especially on one side of your body. °· You are suddenly confused. °· You have trouble talking or understanding. °· You have sudden trouble seeing. °· You have sudden trouble walking or moving your arms or legs. °· You have sudden dizziness. °· You lose your balance or your movements are not smooth. °· You have a sudden, severe headache with no known cause. °· You are partly unaware or totally unaware of what is going on around you. °Any of these symptoms may be a sign of an emergency. Do not wait to see if the symptoms go away. Call for help (911 in [place] Do not   drive yourself to the hospital. °  °This information is not intended to replace advice given to you by your health care provider. Make sure you discuss any questions you have with your health care provider. °  °Document Released: 01/10/2014 Document Reviewed: 01/10/2014 °Elsevier Interactive Patient Education ©2016 [employer] ° ° °

## 2016-06-18 IMAGING — CT CT HEAD W/O CM
1 series · 16 of 30 positions shown, 20 images · non-contrast
Comparison: None.

CLINICAL DATA: Generalized weakness in legs and right arm weakness.
Fall multiple times

EXAM:
CT HEAD WITHOUT CONTRAST
TECHNIQUE: Contiguous axial images were obtained from the base of the skull
through the vertex without intravenous contrast.

[Series 2: head wo · axial · 0.41mm/px · z∈[-86,+45]mm · 16 of 30 slices shown, 20 images]
[im 2/30  brain]
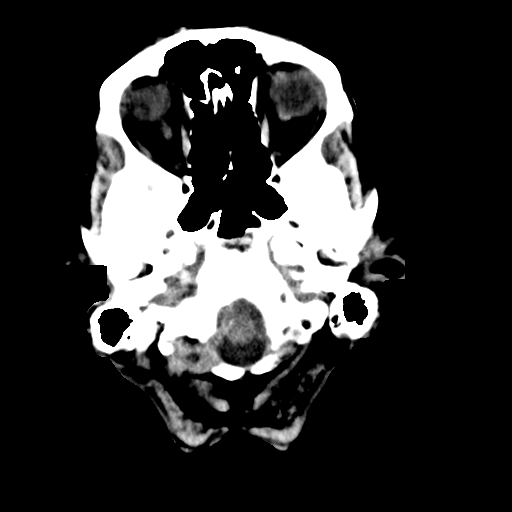
[im 2/30  bone]
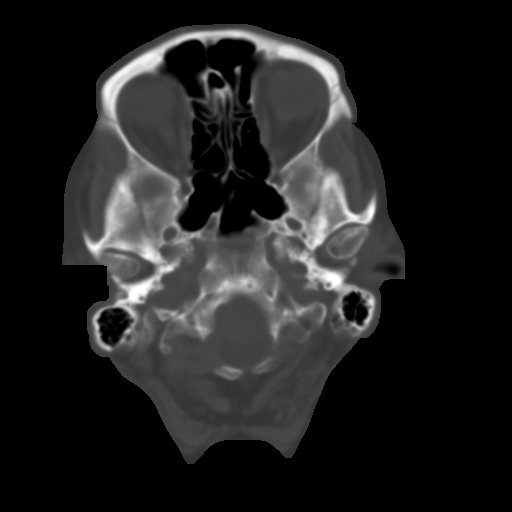
[im 4/30  brain]
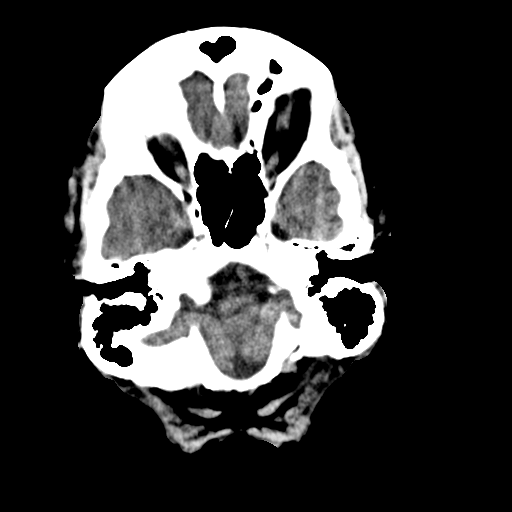
[im 6/30  brain]
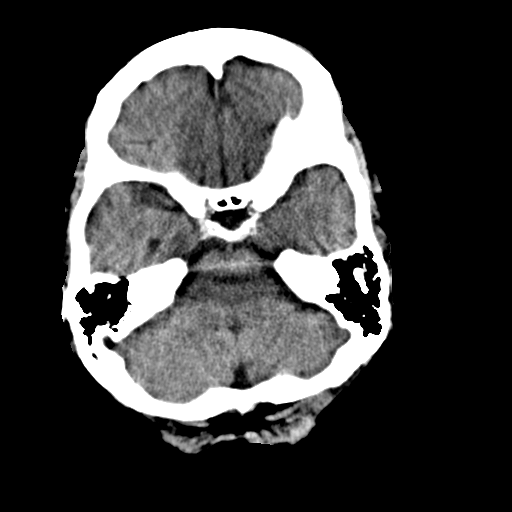
[im 8/30  brain]
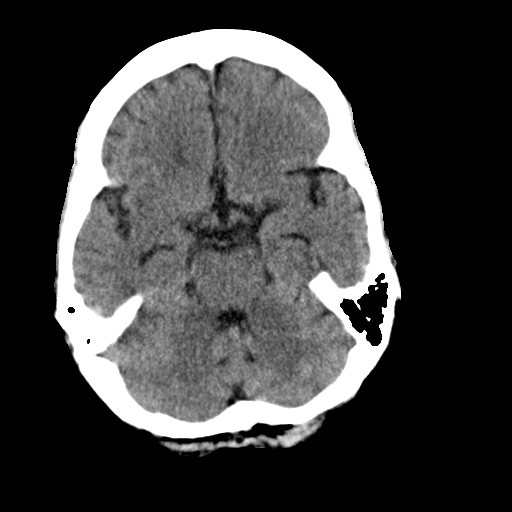
[im 9/30  brain]
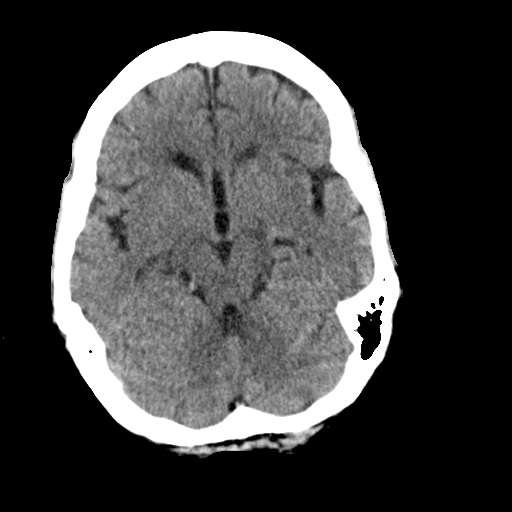
[im 9/30  bone]
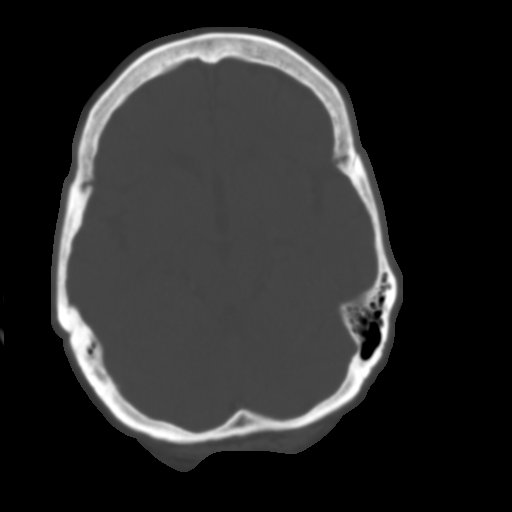
[im 11/30  brain]
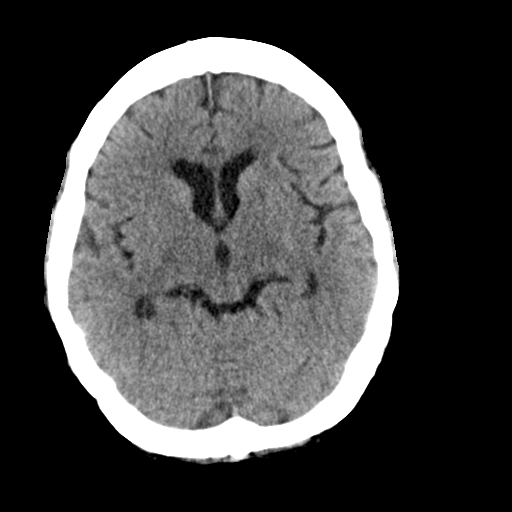
[im 13/30  brain]
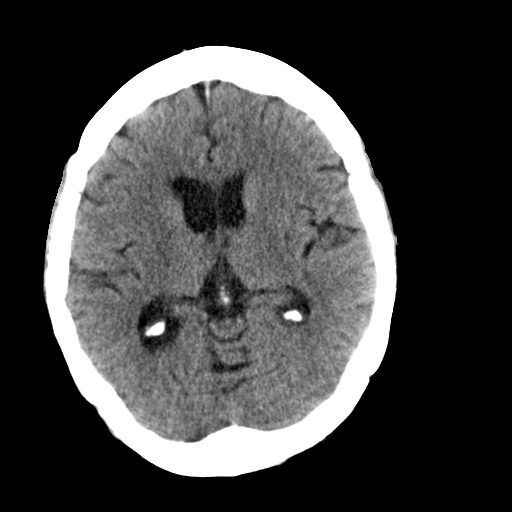
[im 15/30  brain]
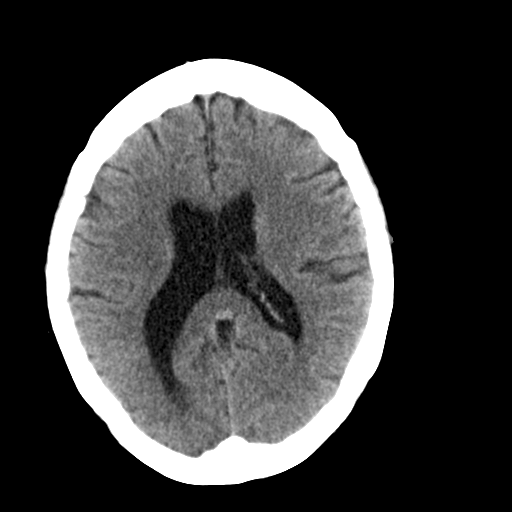
[im 16/30  brain]
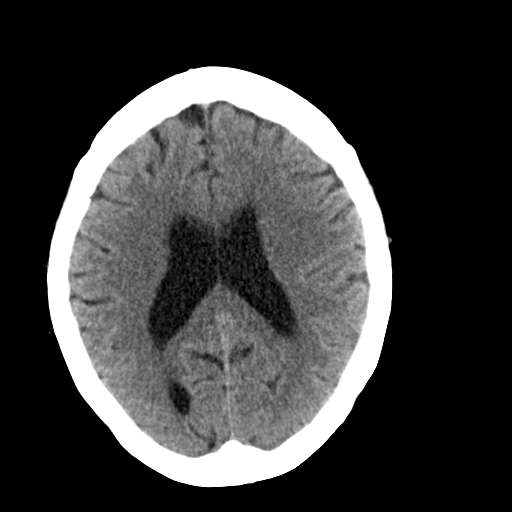
[im 16/30  bone]
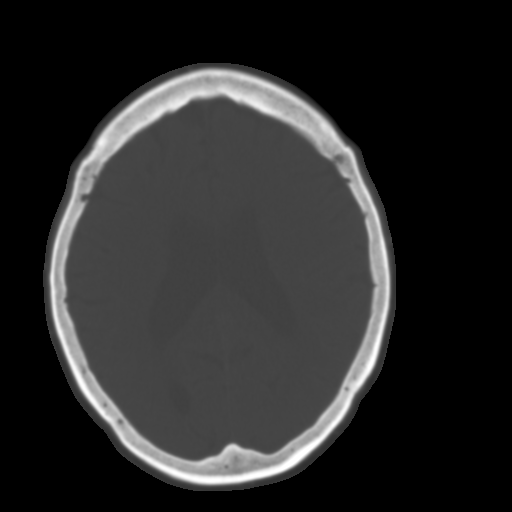
[im 18/30  brain]
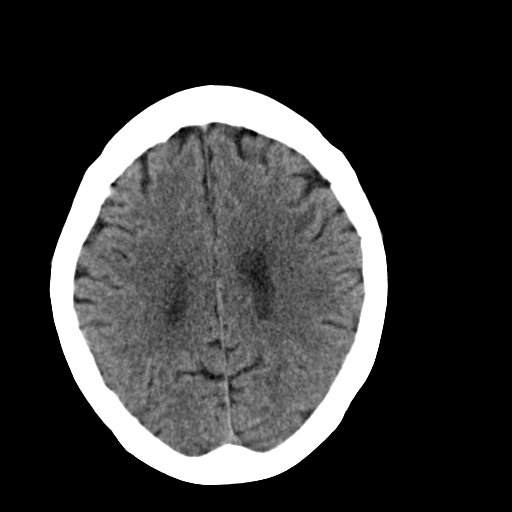
[im 20/30  brain]
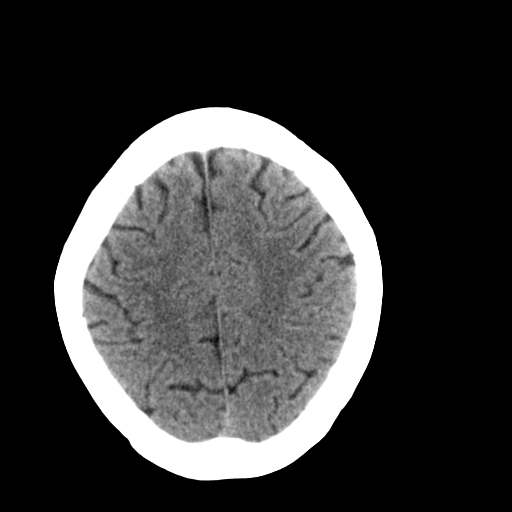
[im 22/30  brain]
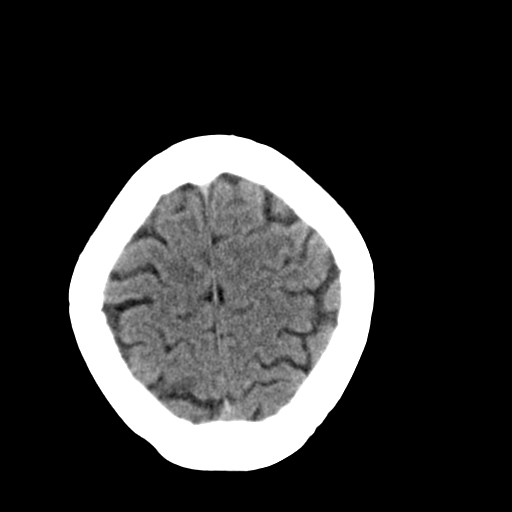
[im 23/30  brain]
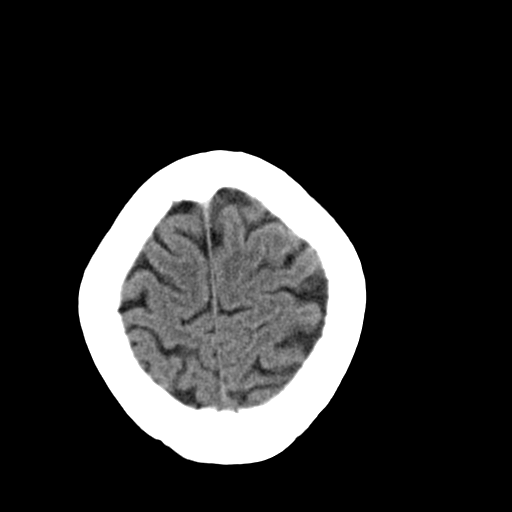
[im 23/30  bone]
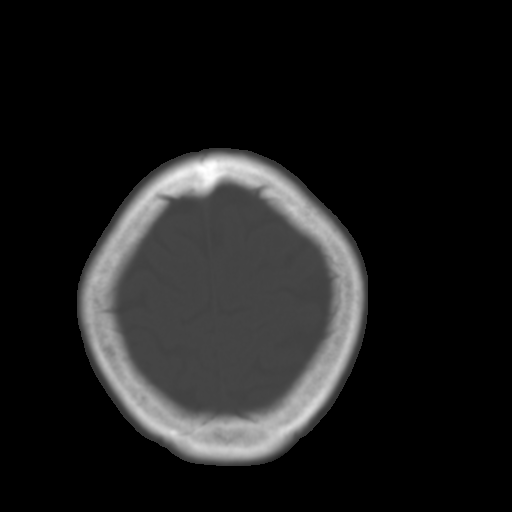
[im 25/30  brain]
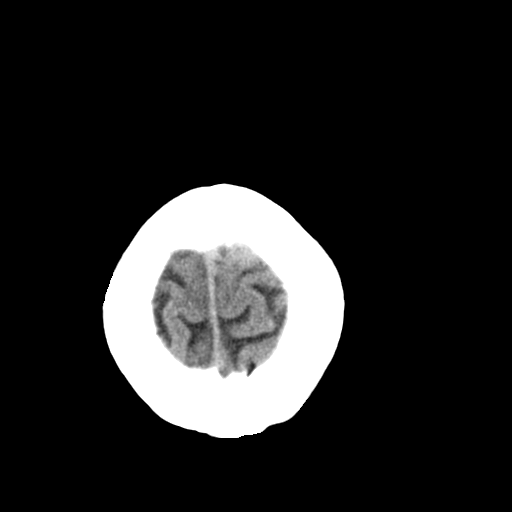
[im 27/30  brain]
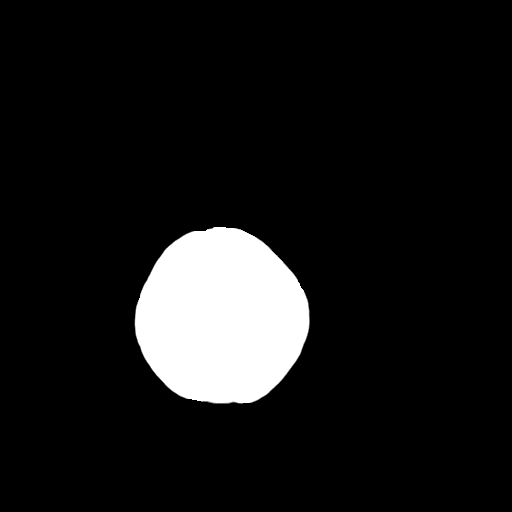
[im 29/30  brain]
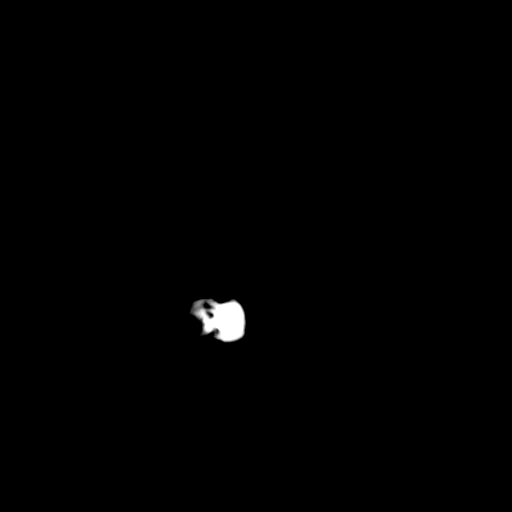

[16 of 30 positions shown; findings below may reference images not displayed]

FINDINGS: No acute intracranial abnormality. Specifically, no hemorrhage,
hydrocephalus, mass lesion, acute infarction, or significant
intracranial injury. No acute calvarial abnormality. Visualized
paranasal sinuses and mastoids clear. Orbital soft tissues
unremarkable.
IMPRESSION: Normal study.

## 2017-11-01 IMAGING — US US CAROTID DUPLEX BILAT
1 series · 13 of 24 positions shown · non-contrast
Comparison: None.

CLINICAL DATA: Stroke.  Hypertension.

EXAM:
BILATERAL CAROTID DUPLEX ULTRASOUND
TECHNIQUE: Gray scale imaging, color Doppler and duplex ultrasound was
performed of bilateral carotid and vertebral arteries in the neck.

[Series 1: us carotid duplex bilat · 0.05mm/px · 13 of 62 slices shown]
[im 1/62]
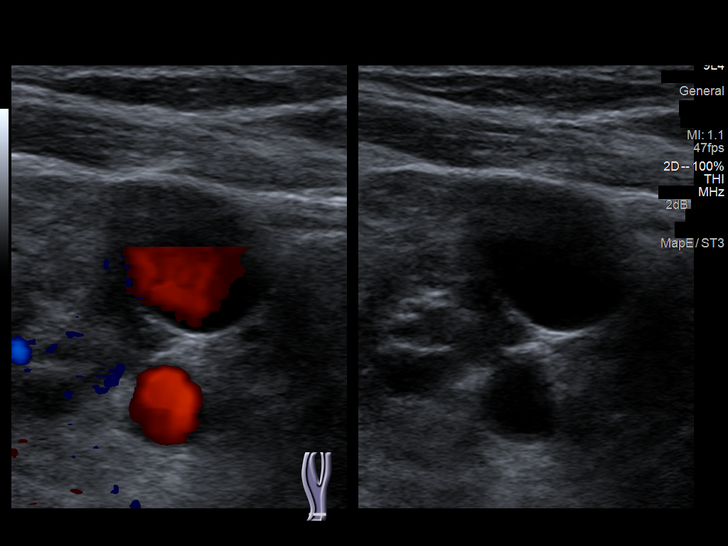
[im 6/62]
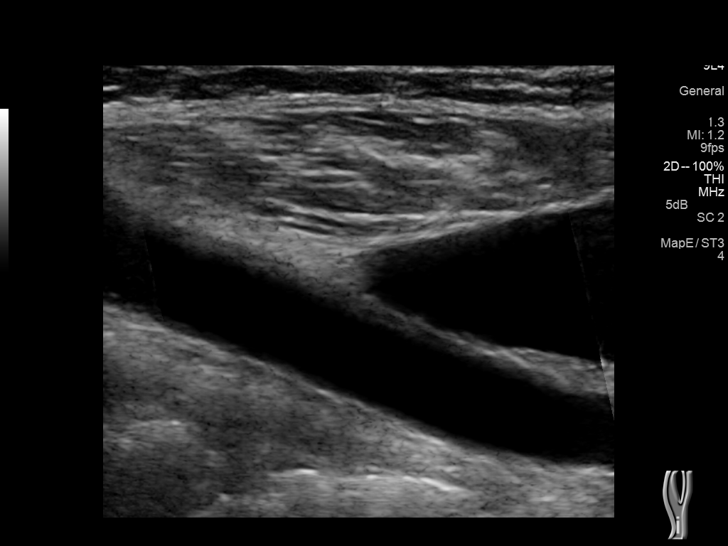
[im 11/62]
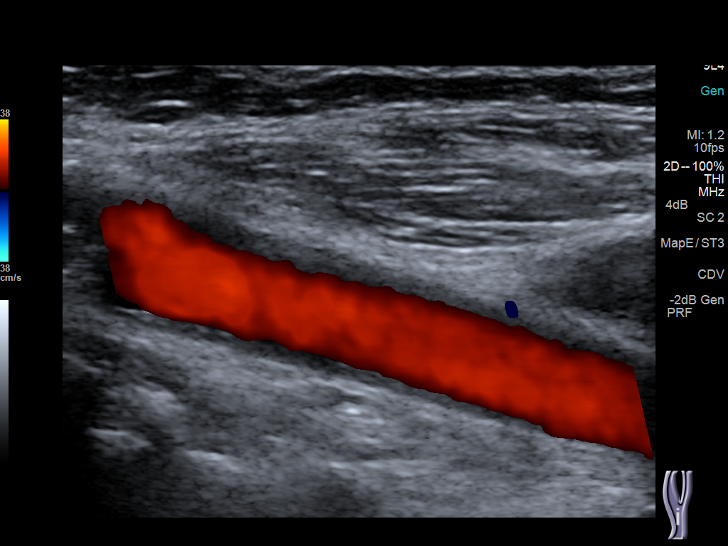
[im 16/62]
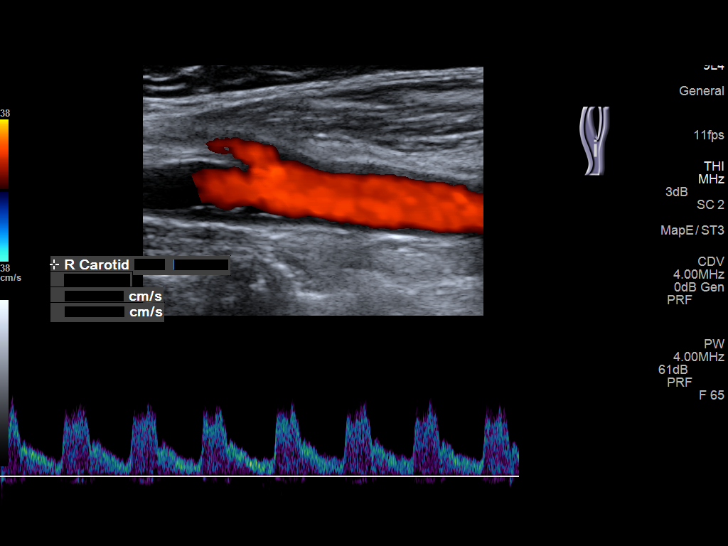
[im 22/62]
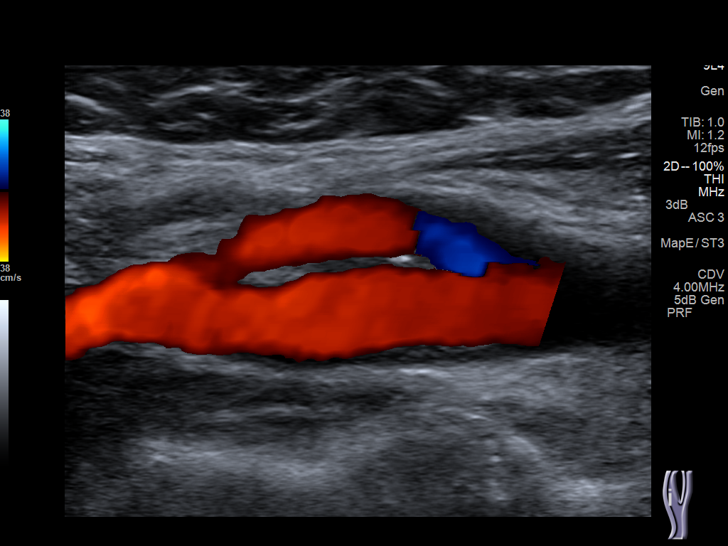
[im 27/62]
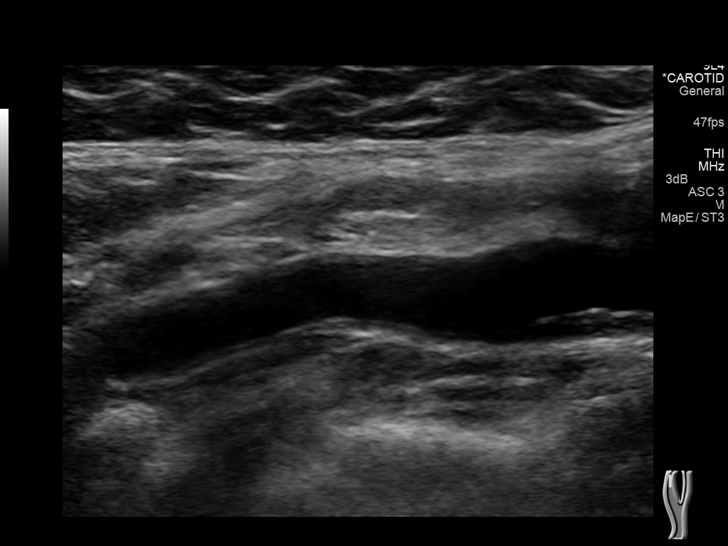
[im 32/62]
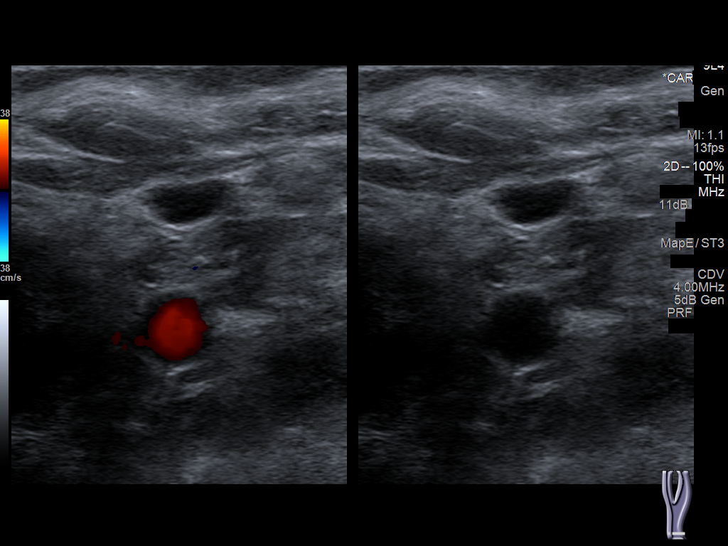
[im 35/62]
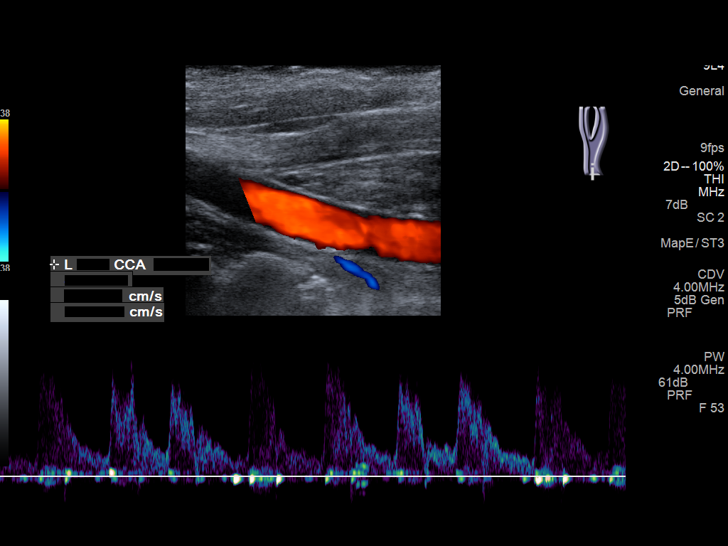
[im 40/62]
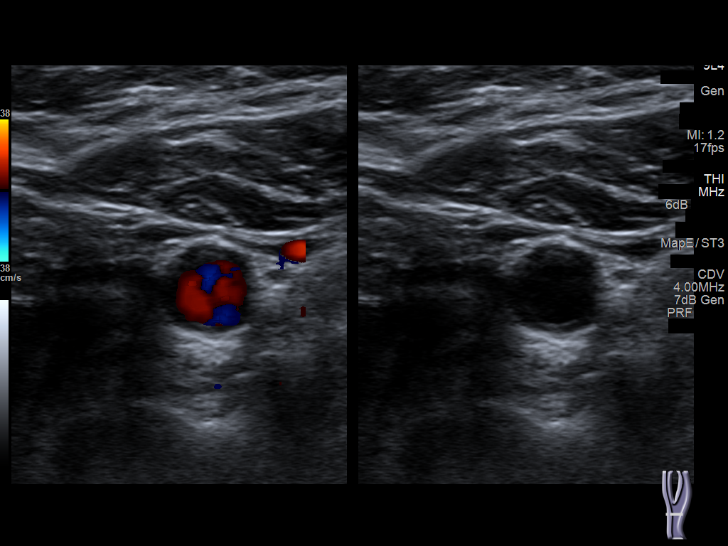
[im 46/62]
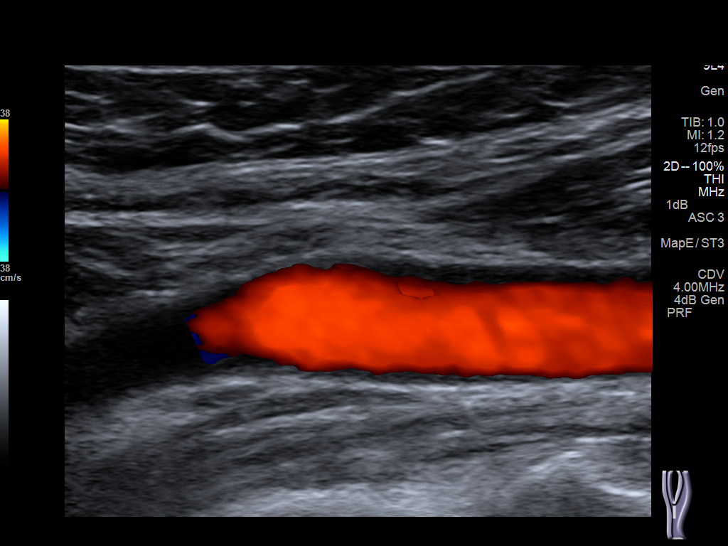
[im 51/62]
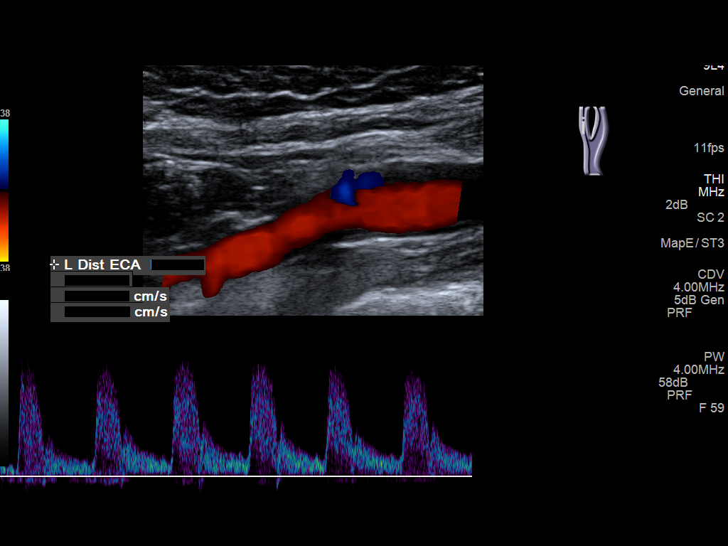
[im 56/62]
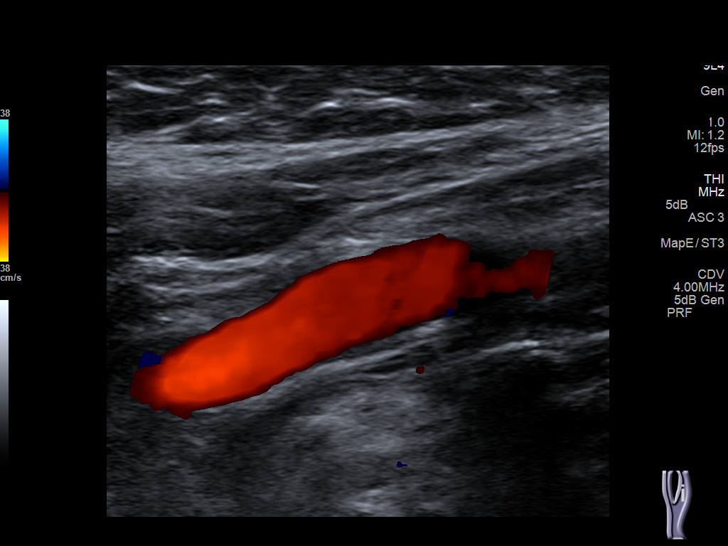
[im 62/62]
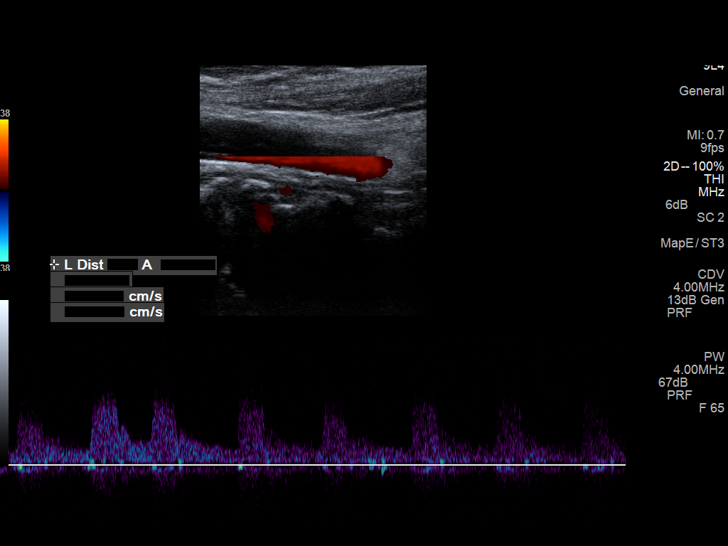

[13 of 24 positions shown; findings below may reference images not displayed]

REVIEW OF SYSTEMS:
Quantification of carotid stenosis is based on velocity parameters
that correlate the residual internal carotid diameter with
NASCET-based stenosis levels, using the diameter of the distal
internal carotid lumen as the denominator for stenosis measurement.

The following velocity measurements were obtained:

PEAK SYSTOLIC/END DIASTOLIC

RIGHT

ICA:                     79/29cm/sec

CCA:                     75/27cm/sec

SYSTOLIC ICA/CCA RATIO:

DIASTOLIC ICA/CCA RATIO:

ECA:                     78cm/sec

LEFT

ICA:                     77/23cm/sec

CCA:                     81/22cm/sec

SYSTOLIC ICA/CCA RATIO:

DIASTOLIC ICA/CCA RATIO:

ECA:                     90cm/sec
FINDINGS: RIGHT CAROTID ARTERY: Smooth plaque in the carotid bulb extending
into the proximal ICA without high-grade stenosis. Normal waveforms
and color Doppler signal.

RIGHT VERTEBRAL ARTERY:  Normal flow direction and waveform.

LEFT CAROTID ARTERY: Eccentric plaque in the carotid bulb and ICA
origin. No high-grade stenosis. Normal waveforms and color Doppler
signal.

LEFT VERTEBRAL ARTERY: Normal flow direction and waveform.
IMPRESSION: 1. Early bilateral carotid bulb and proximal ICA plaque resulting in
less than 50% diameter stenosis. The exam does not exclude plaque
ulceration or embolization. Continued surveillance recommended.
